# Patient Record
Sex: Male | Born: 1950 | Race: Asian | Hispanic: No | Smoking: Current every day smoker
Health system: Southern US, Community
[De-identification: ages and names within clinical notes are randomized; demographics above are authoritative.]

## PROBLEM LIST (undated history)

## (undated) DIAGNOSIS — Z8711 Personal history of peptic ulcer disease: Secondary | ICD-10-CM

## (undated) DIAGNOSIS — Z8719 Personal history of other diseases of the digestive system: Secondary | ICD-10-CM

## (undated) DIAGNOSIS — K219 Gastro-esophageal reflux disease without esophagitis: Secondary | ICD-10-CM

## (undated) DIAGNOSIS — K859 Acute pancreatitis without necrosis or infection, unspecified: Secondary | ICD-10-CM

## (undated) DIAGNOSIS — F1010 Alcohol abuse, uncomplicated: Secondary | ICD-10-CM

## (undated) DIAGNOSIS — Z86010 Personal history of colonic polyps: Principal | ICD-10-CM

## (undated) DIAGNOSIS — D649 Anemia, unspecified: Secondary | ICD-10-CM

## (undated) HISTORY — DX: Acute pancreatitis without necrosis or infection, unspecified: K85.90

## (undated) HISTORY — DX: Personal history of colonic polyps: Z86.010

## (undated) HISTORY — DX: Alcohol abuse, uncomplicated: F10.10

## (undated) HISTORY — DX: Anemia, unspecified: D64.9

## (undated) HISTORY — DX: Gastro-esophageal reflux disease without esophagitis: K21.9

## (undated) HISTORY — DX: Personal history of peptic ulcer disease: Z87.11

## (undated) HISTORY — DX: Personal history of other diseases of the digestive system: Z87.19

---

## 2009-04-10 ENCOUNTER — Emergency Department (HOSPITAL_COMMUNITY): Admission: EM | Admit: 2009-04-10 | Discharge: 2009-04-11 | Payer: Self-pay | Admitting: Emergency Medicine

## 2010-08-11 LAB — POCT I-STAT, CHEM 8
BUN: 15 mg/dL (ref 6–23)
Calcium, Ion: 1.11 mmol/L — ABNORMAL LOW (ref 1.12–1.32)
Chloride: 109 mEq/L (ref 96–112)
Creatinine, Ser: 0.9 mg/dL (ref 0.4–1.5)
Glucose, Bld: 92 mg/dL (ref 70–99)
Potassium: 3.2 mEq/L — ABNORMAL LOW (ref 3.5–5.1)

## 2015-06-23 ENCOUNTER — Ambulatory Visit (HOSPITAL_BASED_OUTPATIENT_CLINIC_OR_DEPARTMENT_OTHER)
Admission: RE | Admit: 2015-06-23 | Discharge: 2015-06-23 | Disposition: A | Discharge status: Home / Self Care | Payer: Medicare Other | Source: Ambulatory Visit | Attending: Medical | Admitting: Medical

## 2015-06-23 ENCOUNTER — Encounter: Payer: Self-pay | Admitting: Medical

## 2015-06-23 ENCOUNTER — Ambulatory Visit (INDEPENDENT_AMBULATORY_CARE_PROVIDER_SITE_OTHER): Payer: Medicare Other | Admitting: Medical

## 2015-06-23 VITALS — BP 118/76 | HR 91 | Temp 98.1°F | Ht 65.5 in | Wt 115.0 lb

## 2015-06-23 DIAGNOSIS — F1010 Alcohol abuse, uncomplicated: Secondary | ICD-10-CM | POA: Insufficient documentation

## 2015-06-23 DIAGNOSIS — F17200 Nicotine dependence, unspecified, uncomplicated: Secondary | ICD-10-CM | POA: Diagnosis not present

## 2015-06-23 DIAGNOSIS — Z87891 Personal history of nicotine dependence: Secondary | ICD-10-CM

## 2015-06-23 DIAGNOSIS — R059 Cough, unspecified: Secondary | ICD-10-CM

## 2015-06-23 DIAGNOSIS — R05 Cough: Secondary | ICD-10-CM

## 2015-06-23 DIAGNOSIS — Z8719 Personal history of other diseases of the digestive system: Secondary | ICD-10-CM

## 2015-06-23 DIAGNOSIS — Z720 Tobacco use: Secondary | ICD-10-CM

## 2015-06-23 DIAGNOSIS — D71 Functional disorders of polymorphonuclear neutrophils: Secondary | ICD-10-CM | POA: Diagnosis not present

## 2015-06-23 DIAGNOSIS — Z8711 Personal history of peptic ulcer disease: Secondary | ICD-10-CM | POA: Insufficient documentation

## 2015-06-23 DIAGNOSIS — R5383 Other fatigue: Secondary | ICD-10-CM

## 2015-06-23 NOTE — Progress Notes (Signed)
Subjective:    Patient ID: Chad Coffey 65, male    DOB: 10-17-1950, 65 y.o.   MRN: [id number]  HPI   I have reviewed pt PMH, PSH, FH, Social History and Surgical History  Pt is retired, no exercise, 1 cup coffee a day, widowed.   Pt has not been examined in some time for routine care.  Pt has some hx of heart burn and reflux in the past. Stomach Ulcer 20 yrs ago.  Pt has been drinking alcohol a lot last 2 years. About 12 pack a day.   Pt also smokes about a pack day. Pack a day past 2 years. Before over past 20 yrs only 2 cigarettes.   He drank and smoked heavy before wife passed but then got a little worse.  Both his daughters are not sure if he is depressed. Pt denies any.   He has been having some memory issues past year. Distant memory intact but recent memory not great per family.  Pt fell about one week ago in tub. Bruised stomach and his arm     Review of Systems  Constitutional: Positive for fatigue. Negative for fever and chills.       Family thinks he is fatigued pt denies. But they say at times he sleeps a lot.  HENT: Negative for congestion.   Respiratory: Positive for cough. Negative for choking, chest tightness, shortness of breath and wheezing.   Cardiovascular: Negative for chest pain and palpitations.  Gastrointestinal: Negative for nausea, abdominal pain, diarrhea, constipation, blood in stool and rectal pain.  Musculoskeletal: Negative for back pain.  Neurological: Negative for dizziness and light-headedness.  Hematological: Negative for adenopathy. Does not bruise/bleed easily.  Psychiatric/Behavioral: Negative for suicidal ideas, confusion, decreased concentration and agitation. The patient is not hyperactive.    Past Medical History  Diagnosis Date  . GERD (gastroesophageal reflux disease)     Social History   Social History  . Marital Status: Widowed    Spouse Name: N/A  . Number of Children: N/A  . Years of Education: N/A    Occupational History  . Not on file.   Social History Main Topics  . Smoking status: Current Every Day Smoker  . Smokeless tobacco: Never Used  . Alcohol Use: 0.0 oz/week    0 Standard drinks or equivalent per week     Comment: daily.   . Drug Use: Not on file  . Sexual Activity: Not on file   Other Topics Concern  . Not on file   Social History Narrative  . No narrative on file    No past surgical history on file.  No family history on file.  No Known Allergies  No current outpatient prescriptions on file prior to visit.   No current facility-administered medications on file prior to visit.    BP 118/76 mmHg  Pulse 91  Temp(Src) 98.1 F (36.7 C) (Oral)  Ht 5' 5.5" (1.664 m)  Wt 115 lb (52.164 kg)  BMI 18.84 kg/m2  SpO2 98%       Objective:   Physical Exam  General Mental Status- Alert. General Appearance- Not in acute distress.   Skin General: Color- Normal Color except faint burise rt upper abdomen  Neck Carotid Arteries- Normal color. Moisture- Normal Moisture. No carotid bruits. No JVD.  Chest and Lung Exam Auscultation: Breath Sounds:-Normal.  Cardiovascular Auscultation:Rythm- Regular. Murmurs & Other Heart Sounds:Auscultation of the heart reveals- No Murmurs.  Abdomen Inspection:-Inspeection Normal. Palpation/Percussion:Note:No mass. Palpation and Percussion of the  abdomen reveal- Non Tender, Non Distended + BS, no rebound or guarding.   Neurologic Cranial Nerve exam:- CN III-XII intact(No nystagmus), symmetric smile. Strength:- 5/5 equal and symmetric strength both upper and lower extremities.  Rt wrist- mild bruise but on palpation wrist and foreram no pain. Good rom.       Assessment & Plan:  With your history of smoking and alcohol abuse the best thing for your health long term is to stop all together or cut back significantly.  We will get labs to day to assess impact of drinking on your body and cxr to check you  lungs.  Follow up in 2 weeks early am and come in fasting. Will check your cholesterol and possible other labs as well.  Note this was pt first time here. Time was limited. We need to get in to lab before closed. I do want to discuss further family concerns about his memory. This may take some time since MMSE will need to be translated. Time did not allow this today.

## 2015-06-23 NOTE — Assessment & Plan Note (Signed)
Will get labs today to assess effects of heavy drinking. Encouraged/recommended to stop.

## 2015-06-23 NOTE — Patient Instructions (Signed)
With your history of smoking and alcohol abuse the best thing for your health long term is to stop all together or cut back significantly.  We will get labs to day to assess impact of drinking on your body and cxr to check you lungs.  Follow up in 2 weeks early am and come in fasting. Will check your cholesterol and possible other labs as well.

## 2015-06-23 NOTE — Progress Notes (Signed)
Pre visit review using our clinic review tool, if applicable. No additional management support is needed unless otherwise documented below in the visit note. 

## 2015-06-23 NOTE — Assessment & Plan Note (Signed)
Likely smoker cough and get xray today.

## 2015-06-24 ENCOUNTER — Telehealth: Payer: Self-pay | Admitting: Medical

## 2015-06-24 LAB — COMPREHENSIVE METABOLIC PANEL
ALBUMIN: 4.4 g/dL (ref 3.5–5.2)
ALK PHOS: 85 U/L (ref 39–117)
ALT: 66 U/L — ABNORMAL HIGH (ref 0–53)
AST: 92 U/L — ABNORMAL HIGH (ref 0–37)
BUN: 9 mg/dL (ref 6–23)
CALCIUM: 9.5 mg/dL (ref 8.4–10.5)
CO2: 24 mEq/L (ref 19–32)
Chloride: 87 mEq/L — ABNORMAL LOW (ref 96–112)
Creatinine, Ser: 1.04 mg/dL (ref 0.40–1.50)
GFR: 76.14 mL/min (ref >60.00)
Glucose, Bld: 87 mg/dL (ref 70–99)
POTASSIUM: 2.9 meq/L — AB (ref 3.5–5.1)
SODIUM: 127 meq/L — AB (ref 135–145)
TOTAL PROTEIN: 7.8 g/dL (ref 6.0–8.3)
Total Bilirubin: 0.8 mg/dL (ref 0.2–1.2)

## 2015-06-24 LAB — CBC WITH DIFFERENTIAL/PLATELET
Basophils Absolute: 0 10*3/uL (ref 0.0–0.1)
Basophils Relative: 0.3 % (ref 0.0–3.0)
EOS PCT: 2.1 % (ref 0.0–5.0)
Eosinophils Absolute: 0.2 10*3/uL (ref 0.0–0.7)
HEMATOCRIT: 37.5 % — AB (ref 39.0–52.0)
HEMOGLOBIN: 12.8 g/dL — AB (ref 13.0–17.0)
LYMPHS PCT: 20.1 % (ref 12.0–46.0)
Lymphs Abs: 1.8 10*3/uL (ref 0.7–4.0)
MCHC: 34 g/dL (ref 30.0–36.0)
MCV: 82.1 fl (ref 78.0–100.0)
MONO ABS: 1 10*3/uL (ref 0.1–1.0)
Monocytes Relative: 11.8 % (ref 3.0–12.0)
Neutro Abs: 5.8 10*3/uL (ref 1.4–7.7)
Neutrophils Relative %: 65.7 % (ref 43.0–77.0)
Platelets: 306 10*3/uL (ref 150.0–400.0)
RBC: 4.57 Mil/uL (ref 4.22–5.81)
RDW: 13.7 % (ref 11.5–15.5)
WBC: 8.9 10*3/uL (ref 4.0–10.5)

## 2015-06-24 LAB — AMYLASE: AMYLASE: 172 U/L — AB (ref 27–131)

## 2015-06-24 LAB — GAMMA GT: GGT: 165 U/L — AB (ref 7–51)

## 2015-06-24 LAB — LIPASE: Lipase: 84 U/L — ABNORMAL HIGH (ref 11.0–59.0)

## 2015-06-24 MED ORDER — POTASSIUM CHLORIDE CRYS ER 20 MEQ PO TBCR: 20.0000 meq | EXTENDED_RELEASE_TABLET | Freq: Every day | ORAL | Status: DC

## 2015-06-24 NOTE — Telephone Encounter (Signed)
Sent k tabs to his pharmacy

## 2015-06-25 NOTE — Telephone Encounter (Signed)
Pt daughter was made aware on 06/24/15.

## 2015-06-27 ENCOUNTER — Ambulatory Visit (INDEPENDENT_AMBULATORY_CARE_PROVIDER_SITE_OTHER): Payer: Medicare Other | Admitting: Medical

## 2015-06-27 ENCOUNTER — Encounter: Payer: Self-pay | Admitting: Internal Medicine

## 2015-06-27 ENCOUNTER — Encounter: Payer: Self-pay | Admitting: Medical

## 2015-06-27 ENCOUNTER — Ambulatory Visit (HOSPITAL_BASED_OUTPATIENT_CLINIC_OR_DEPARTMENT_OTHER)
Admission: RE | Admit: 2015-06-27 | Discharge: 2015-06-27 | Disposition: A | Discharge status: Home / Self Care | Payer: Medicare Other | Source: Ambulatory Visit | Attending: Medical | Admitting: Medical

## 2015-06-27 VITALS — BP 138/99 | HR 98 | Temp 97.7°F | Ht 65.5 in | Wt 114.4 lb

## 2015-06-27 DIAGNOSIS — F1010 Alcohol abuse, uncomplicated: Secondary | ICD-10-CM | POA: Insufficient documentation

## 2015-06-27 DIAGNOSIS — K5730 Diverticulosis of large intestine without perforation or abscess without bleeding: Secondary | ICD-10-CM | POA: Insufficient documentation

## 2015-06-27 DIAGNOSIS — K858 Other acute pancreatitis without necrosis or infection: Secondary | ICD-10-CM

## 2015-06-27 DIAGNOSIS — K760 Fatty (change of) liver, not elsewhere classified: Secondary | ICD-10-CM | POA: Insufficient documentation

## 2015-06-27 DIAGNOSIS — K859 Acute pancreatitis without necrosis or infection, unspecified: Secondary | ICD-10-CM | POA: Diagnosis present

## 2015-06-27 DIAGNOSIS — Z113 Encounter for screening for infections with a predominantly sexual mode of transmission: Secondary | ICD-10-CM | POA: Diagnosis not present

## 2015-06-27 DIAGNOSIS — Z1211 Encounter for screening for malignant neoplasm of colon: Secondary | ICD-10-CM

## 2015-06-27 DIAGNOSIS — D649 Anemia, unspecified: Secondary | ICD-10-CM

## 2015-06-27 LAB — COMPREHENSIVE METABOLIC PANEL
ALBUMIN: 4.5 g/dL (ref 3.5–5.2)
ALT: 57 U/L — ABNORMAL HIGH (ref 0–53)
AST: 63 U/L — ABNORMAL HIGH (ref 0–37)
Alkaline Phosphatase: 81 U/L (ref 39–117)
BUN: 6 mg/dL (ref 6–23)
CHLORIDE: 92 meq/L — AB (ref 96–112)
CO2: 26 mEq/L (ref 19–32)
Calcium: 9.8 mg/dL (ref 8.4–10.5)
Creatinine, Ser: 1.04 mg/dL (ref 0.40–1.50)
GFR: 76.14 mL/min (ref >60.00)
Glucose, Bld: 106 mg/dL — ABNORMAL HIGH (ref 70–99)
POTASSIUM: 3 meq/L — AB (ref 3.5–5.1)
SODIUM: 135 meq/L (ref 135–145)
Total Bilirubin: 0.6 mg/dL (ref 0.2–1.2)
Total Protein: 7.9 g/dL (ref 6.0–8.3)

## 2015-06-27 LAB — IRON AND TIBC
%SAT: 31 % (ref 15–60)
IRON: 97 ug/dL (ref 50–180)
TIBC: 314 ug/dL (ref 250–425)
UIBC: 217 ug/dL (ref 125–400)

## 2015-06-27 LAB — LIPASE: Lipase: 59 U/L (ref 11.0–59.0)

## 2015-06-27 LAB — FERRITIN: FERRITIN: 246.9 ng/mL (ref 22.0–322.0)

## 2015-06-27 LAB — AMYLASE: Amylase: 97 U/L (ref 27–131)

## 2015-06-27 MED ORDER — IOHEXOL 300 MG/ML  SOLN
100.0000 mL | Freq: Once | INTRAMUSCULAR | Status: AC | PRN
  Administered 2015-06-27: 100 mL via INTRAVENOUS

## 2015-06-27 NOTE — Patient Instructions (Addendum)
Extremely important to stop drinking alcohol. You have decreased but now try to stop completley.  Avoid fatty and greasy foods with pancrease inflamed. Hydrate well. Recommend propel fitness water and eat bland foods.   If abdomen pain increases or other symptoms as discussed then ED evaluation.  Will give tdap, psv 23, today.  GI referral.   Repeat labs today. Ct abd/pelvis today stat.  Get ct of abdomen today. Follow up in 7 days or as needed

## 2015-06-27 NOTE — Progress Notes (Signed)
Pre visit review using our clinic review tool, if applicable. No additional management support is needed unless otherwise documented below in the visit note. 

## 2015-06-27 NOTE — Progress Notes (Signed)
Subjective:    Patient ID: Javoni 74, male    DOB: 1951/04/22, 65 y.o.   MRN: [id number]  HPI  Pt in for follow up. I reviewed labs  low na, low k, increased lft, increased amylase, increased lipase, mild anemia, and increased ggt.   No abdomen pain(but stoic and he never complains per daughters), no nausea, no vomiting, and no back pain.  Pt was advised to stop drinking etoh  completely. He was drinking 12 pack a day. No only 4 beers a day.      Review of Systems  Constitutional: Negative for fever, chills and fatigue.  Respiratory: Negative for cough, chest tightness, shortness of breath and wheezing.   Cardiovascular: Negative for chest pain and palpitations.  Gastrointestinal: Negative for nausea, vomiting, abdominal pain, diarrhea, constipation, blood in stool, abdominal distention and rectal pain.  Musculoskeletal: Negative for back pain.  Skin: Negative for rash.  Hematological: Negative for adenopathy. Does not bruise/bleed easily.   Past Medical History  Diagnosis Date  . GERD (gastroesophageal reflux disease)     Social History   Social History  . Marital Status: Widowed    Spouse Name: N/A  . Number of Children: N/A  . Years of Education: N/A   Occupational History  . Not on file.   Social History Main Topics  . Smoking status: Current Every Day Smoker  . Smokeless tobacco: Never Used  . Alcohol Use: 0.0 oz/week    0 Standard drinks or equivalent per week     Comment: daily.   . Drug Use: Not on file  . Sexual Activity: Not on file   Other Topics Concern  . Not on file   Social History Narrative    No past surgical history on file.  No family history on file.  No Known Allergies  Current Outpatient Prescriptions on File Prior to Visit  Medication Sig Dispense Refill  . potassium chloride SA (K-DUR,KLOR-CON) 20 MEQ tablet Take 1 tablet (20 mEq total) by mouth daily. 14 tablet 0   No current facility-administered medications on file  prior to visit.    BP 138/99 mmHg  Pulse 98  Temp(Src) 97.7 F (36.5 C) (Oral)  Ht 5' 5.5" (1.664 m)  Wt 114 lb 6.4 oz (51.891 kg)  BMI 18.74 kg/m2       Objective:   Physical Exam  Physical Exam  General Mental Status- Alert. General Appearance- Not in acute distress.   Skin General: Color- Normal Color except faint burise rt upper abdomen  Neck Carotid Arteries- Normal color. Moisture- Normal Moisture. No carotid bruits. No JVD.  Chest and Lung Exam Auscultation: Breath Sounds:-Normal.  Cardiovascular Auscultation:Rythm- Regular. Murmurs & Other Heart Sounds:Auscultation of the heart reveals- No Murmurs.  Abdomen Inspection:-Inspeection Normal. Palpation/Percussion:Note:No mass. Palpation and Percussion of the abdomen reveal- Non Tender, Non Distended + BS, no rebound or guarding.   Neurologic Cranial Nerve exam:- CN III-XII intact(No nystagmus), symmetric smile. Strength:- 5/5 equal and symmetric strength both upper and lower extremities.  Rt wrist- mild bruise but on palpation wrist and foreram no pain. Good rom.       Assessment & Plan:  Extremely important to stop drinking alcohol. You have decreased but now try to stop completley.  Avoid fatty and greasy foods with pancrease inflamed. Hydrate well. Recommend propel fitness water and eat bland foods.   If abdomen pain increases or other symptoms as discussed then ED evaluation.  Will give tdap, psv 23, today.  GI referral.  Follow  up in 7 days or as needed

## 2015-07-02 ENCOUNTER — Telehealth: Payer: Self-pay | Admitting: Medical

## 2015-07-02 LAB — VITAMIN B1: Vitamin B1 (Thiamine): <7 nmol/L — ABNORMAL LOW (ref 8–30)

## 2015-07-02 MED ORDER — VITAMIN B-1 100 MG PO TABS: 100.0000 mg | ORAL_TABLET | Freq: Every day | ORAL | Status: AC

## 2015-07-02 NOTE — Progress Notes (Signed)
Quick Note:  Pt has seen results on MyChart and message also sent for patient to call back if any questions. ______ 

## 2015-07-02 NOTE — Telephone Encounter (Signed)
rx thiamine sent to his pharmacy. Repeat b1 level in one month after he starts.

## 2015-07-02 NOTE — Telephone Encounter (Signed)
Left message for daughter to call back with any questions. They have seen the results on MyChart.

## 2015-07-07 ENCOUNTER — Ambulatory Visit: Payer: Medicare Other | Admitting: Medical

## 2015-07-08 ENCOUNTER — Encounter: Payer: Self-pay | Admitting: Medical

## 2015-07-08 ENCOUNTER — Telehealth: Payer: Self-pay | Admitting: Medical

## 2015-07-08 ENCOUNTER — Ambulatory Visit (INDEPENDENT_AMBULATORY_CARE_PROVIDER_SITE_OTHER): Payer: Medicare Other | Admitting: Medical

## 2015-07-08 VITALS — BP 120/80 | HR 68 | Temp 98.0°F | Ht 66.0 in | Wt 117.6 lb

## 2015-07-08 DIAGNOSIS — Z113 Encounter for screening for infections with a predominantly sexual mode of transmission: Secondary | ICD-10-CM | POA: Diagnosis not present

## 2015-07-08 DIAGNOSIS — E876 Hypokalemia: Secondary | ICD-10-CM

## 2015-07-08 DIAGNOSIS — Z23 Encounter for immunization: Secondary | ICD-10-CM | POA: Diagnosis not present

## 2015-07-08 DIAGNOSIS — F1010 Alcohol abuse, uncomplicated: Secondary | ICD-10-CM | POA: Diagnosis not present

## 2015-07-08 LAB — COMPREHENSIVE METABOLIC PANEL
ALK PHOS: 54 U/L (ref 39–117)
ALT: 24 U/L (ref 0–53)
AST: 28 U/L (ref 0–37)
Albumin: 3.9 g/dL (ref 3.5–5.2)
BILIRUBIN TOTAL: 0.6 mg/dL (ref 0.2–1.2)
BUN: 7 mg/dL (ref 6–23)
CALCIUM: 8.9 mg/dL (ref 8.4–10.5)
CO2: 30 mEq/L (ref 19–32)
Chloride: 102 mEq/L (ref 96–112)
Creatinine, Ser: 1.06 mg/dL (ref 0.40–1.50)
GFR: 74.48 mL/min (ref >60.00)
Glucose, Bld: 93 mg/dL (ref 70–99)
POTASSIUM: 3.2 meq/L — AB (ref 3.5–5.1)
Sodium: 137 mEq/L (ref 135–145)
TOTAL PROTEIN: 7 g/dL (ref 6.0–8.3)

## 2015-07-08 MED ORDER — POTASSIUM CHLORIDE CRYS ER 20 MEQ PO TBCR: 20.0000 meq | EXTENDED_RELEASE_TABLET | Freq: Every day | ORAL | Status: AC

## 2015-07-08 NOTE — Patient Instructions (Signed)
Please continue to reduce alcohol intake. Continue with thiamine otc.  Will get your k level today to determine if still low. With increased appetite and better diet this may come up without supplementation.  Will get hiv, screening, tdap vacccine, psv 23 vaccine and influenza vaccine.  Follow up date to be determined after lab review.

## 2015-07-08 NOTE — Progress Notes (Signed)
Pre visit review using our clinic review tool, if applicable. No additional management support is needed unless otherwise documented below in the visit note. 

## 2015-07-08 NOTE — Telephone Encounter (Signed)
rx refill of k-dur. His k is improved but still low. In addition to refill of k tab. Eat a banana every other day.

## 2015-07-08 NOTE — Progress Notes (Signed)
Subjective:    Patient ID: Chad Coffey 28, male    DOB: Jul 20, 1950, 65 y.o.   MRN: [id number]  HPI   Pt in for follow up.   Pt is still cutting back on alcohol. He is eating more now that cutting back.(Pt is drinking about 4 beers a day) Pt has been taking vitamin b1.   Pt is just now finishing up on his k tablets.  Pt only had coffee today.  Pt has GI appointment in April or march for colnosocpy.     Review of Systems  Constitutional: Negative for fever, chills and fatigue.  Respiratory: Negative for cough, chest tightness, shortness of breath and wheezing.   Cardiovascular: Negative for chest pain and palpitations.  Gastrointestinal: Negative for abdominal pain.  Musculoskeletal: Negative for myalgias, back pain and joint swelling.  Skin: Negative for rash.  Neurological: Negative for dizziness and headaches.  Hematological: Negative for adenopathy. Does not bruise/bleed easily.  Psychiatric/Behavioral: Negative for behavioral problems and confusion.    Past Medical History  Diagnosis Date  . GERD (gastroesophageal reflux disease)     Social History   Social History  . Marital Status: Widowed    Spouse Name: N/A  . Number of Children: N/A  . Years of Education: N/A   Occupational History  . Not on file.   Social History Main Topics  . Smoking status: Current Every Day Smoker  . Smokeless tobacco: Never Used  . Alcohol Use: 0.0 oz/week    0 Standard drinks or equivalent per week     Comment: daily.   . Drug Use: Not on file  . Sexual Activity: Not on file   Other Topics Concern  . Not on file   Social History Narrative    No past surgical history on file.  No family history on file.  No Known Allergies  Current Outpatient Prescriptions on File Prior to Visit  Medication Sig Dispense Refill  . potassium chloride SA (K-DUR,KLOR-CON) 20 MEQ tablet Take 1 tablet (20 mEq total) by mouth daily. 14 tablet 0  . thiamine (VITAMIN B-1) 100 MG tablet  Take 1 tablet (100 mg total) by mouth daily. 30 tablet 0   No current facility-administered medications on file prior to visit.    BP 120/80 mmHg  Pulse 68  Temp(Src) 98 F (36.7 C) (Oral)  Ht 5\' 6"  (1.676 m)  Wt 117 lb 9.6 oz (53.343 kg)  BMI 18.99 kg/m2  SpO2 98%       Objective:   Physical Exam  General Mental Status- Alert. General Appearance- Not in acute distress.   Skin General: Color- Normal Color. Moisture- Normal Moisture.  Neck Carotid Arteries- Normal color. Moisture- Normal Moisture. No carotid bruits. No JVD.  Chest and Lung Exam Auscultation: Breath Sounds:-Normal.  Cardiovascular Auscultation:Rythm- Regular. Murmurs & Other Heart Sounds:Auscultation of the heart reveals- No Murmurs.  Abdomen Inspection:-Inspeection Normal. Palpation/Percussion:Note:No mass. Palpation and Percussion of the abdomen reveal- Non Tender, Non Distended + BS, no rebound or guarding.    Neurologic Cranial Nerve exam:- CN III-XII intact(No nystagmus), symmetric smile. Strength:- 5/5 equal and symmetric strength both upper and lower extremities.      Assessment & Plan:  Please continue to reduce alcohol intake. Continue with thiamine otc.  Will get your k level today to determine if still low. With increased appetite and better diet this may come up without supplementation.  Will get hiv, screening, tdap vacccine, psv 23 vaccine and influenza vaccine.  Follow up date to be  determined after lab review.

## 2015-07-09 LAB — HIV ANTIBODY (ROUTINE TESTING W REFLEX): HIV 1&2 Ab, 4th Generation: NONREACTIVE

## 2015-07-10 ENCOUNTER — Other Ambulatory Visit (INDEPENDENT_AMBULATORY_CARE_PROVIDER_SITE_OTHER): Payer: Medicare Other

## 2015-07-10 DIAGNOSIS — Z8719 Personal history of other diseases of the digestive system: Secondary | ICD-10-CM

## 2015-07-10 DIAGNOSIS — Z8711 Personal history of peptic ulcer disease: Secondary | ICD-10-CM

## 2015-07-10 DIAGNOSIS — R5383 Other fatigue: Secondary | ICD-10-CM | POA: Diagnosis not present

## 2015-07-10 LAB — FECAL OCCULT BLOOD, IMMUNOCHEMICAL: Fecal Occult Bld: NEGATIVE

## 2015-07-10 NOTE — Progress Notes (Signed)
Quick Note:  Pt has seen results on MyChart and message also sent for patient to call back if any questions. ______ 

## 2015-07-14 NOTE — Progress Notes (Signed)
Quick Note:  Pt has seen results on MyChart and message also sent for patient to call back if any questions. ______ 

## 2015-08-15 ENCOUNTER — Ambulatory Visit (INDEPENDENT_AMBULATORY_CARE_PROVIDER_SITE_OTHER): Payer: Medicare Other | Admitting: Internal Medicine

## 2015-08-15 ENCOUNTER — Encounter: Payer: Self-pay | Admitting: Internal Medicine

## 2015-08-15 VITALS — BP 186/100 | HR 88 | Ht 65.0 in | Wt 126.2 lb

## 2015-08-15 DIAGNOSIS — F1020 Alcohol dependence, uncomplicated: Secondary | ICD-10-CM

## 2015-08-15 DIAGNOSIS — Z1211 Encounter for screening for malignant neoplasm of colon: Secondary | ICD-10-CM | POA: Diagnosis not present

## 2015-08-15 DIAGNOSIS — K700 Alcoholic fatty liver: Secondary | ICD-10-CM

## 2015-08-15 DIAGNOSIS — K852 Alcohol induced acute pancreatitis without necrosis or infection: Secondary | ICD-10-CM | POA: Diagnosis not present

## 2015-08-15 NOTE — Patient Instructions (Signed)
   You have been scheduled for a colonoscopy. Please follow written instructions given to you at your visit today.  Please pick up your prep supplies at the pharmacy. If you use inhalers (even only as needed), please bring them with you on the day of your procedure.   Please reduce your alcohol intake to 2 drinks or less daily.  Information given on alcohol use.    I appreciate the opportunity to care for you.

## 2015-08-15 NOTE — Progress Notes (Signed)
  Referred by: Mackie Pai, PA-C  Subjective:    Patient ID: Amedeo 5, male    DOB: 12/16/1950, 65 y.o.   MRN: [id number] Chief complaint: Recent pancreatitis, alcohol abuse HPI Patient is a pleasant [place] man here with 2 of his daughters, at the request of the physician assistant listed above because of recent alcoholic pancreatitis. The patient was drinking 12 beers a day or more, and was not feeling well had elevated amylase and lipase, also elevated transaminases. Further workup with hepatitis C antibody and other labs did not reveal any particular cause other than alcohol. An abdominal and pelvic CT scan showed fatty liver and normal pancreas. With great reduction in alcohol down to about 4 beers a day his LFTs and pancreas enzymes normalized. Denies any pain or problems now. His daughters are interested in pursuing a screening colonoscopy and he is willing to do that. Medications, allergies, past medical history, past surgical history, family history and social history are reviewed and updated in the EMR.   Review of Systems As above. All other review of systems are negative    Objective:   Physical Exam @BP  186/100 mmHg  Pulse 88  Ht 5\' 5"  (1.651 m)  Wt 126 lb 4 oz (57.267 kg)  BMI 21.01 kg/m2@  General:  Well-developed, well-nourished and in no acute distress he is a thin small but normal-appearing Asian man Eyes:  anicteric. ENT:   Mouth and posterior pharynx free of lesions.  Neck:   supple w/o thyromegaly or mass.  Lungs: Clear to auscultation bilaterally. Heart:  S1S2, no rubs, murmurs, gallops. Abdomen:  soft, non-tender, no hepatosplenomegaly, hernia, or mass and BS+.  Rectal: Deferred Lymph:  no cervical or supraclavicular adenopathy. Extremities:   no edema, cyanosis or clubbing Skin   no rash. Neuro:  A&O x 3.  Psych:  appropriate mood and  Affect.   Data Reviewed: As per history of present illness have reviewed primary care notes, labs in the  computer. These are from 2017.       Assessment & Plan:   Encounter Diagnoses  Name Primary?  Marland Kitchen Uncomplicated alcohol dependence ([place]) Yes  . Alcoholic fatty liver   . Acute alcoholic pancreatitis   . Colon cancer screening    He is improved with reduced alcohol. I've advised not to drink more than 2 beers a day. Budesonide quit. Educated about reducing and eliminating alcohol and gave handouts Screening colonoscopy The risks and benefits as well as alternatives of endoscopic procedure(s) have been discussed and reviewed. All questions answered. The patient agrees to proceed.

## 2015-08-18 ENCOUNTER — Ambulatory Visit (AMBULATORY_SURGERY_CENTER): Payer: Medicare Other | Admitting: Internal Medicine

## 2015-08-18 ENCOUNTER — Encounter: Payer: Self-pay | Admitting: Internal Medicine

## 2015-08-18 VITALS — BP 184/116 | HR 85 | Temp 98.0°F | Resp 24 | Ht 65.0 in | Wt 126.0 lb

## 2015-08-18 DIAGNOSIS — D121 Benign neoplasm of appendix: Secondary | ICD-10-CM

## 2015-08-18 DIAGNOSIS — D129 Benign neoplasm of anus and anal canal: Secondary | ICD-10-CM

## 2015-08-18 DIAGNOSIS — Z1211 Encounter for screening for malignant neoplasm of colon: Secondary | ICD-10-CM

## 2015-08-18 DIAGNOSIS — D128 Benign neoplasm of rectum: Secondary | ICD-10-CM

## 2015-08-18 MED ORDER — SODIUM CHLORIDE 0.9 % IV SOLN: 500.0000 mL | INTRAVENOUS | Status: DC

## 2015-08-18 NOTE — Progress Notes (Signed)
Called to room to assist during endoscopic procedure.  Patient ID and intended procedure confirmed with present staff. Received instructions for my participation in the procedure from the performing physician.  

## 2015-08-18 NOTE — Op Note (Signed)
[facility] Patient Name: Chad Coffey Procedure Date: 08/18/2015 2:39 PM MRN: [id number] Endoscopist: Gatha Mayer , MD Age: 65 Date of Birth: 30-Jun-1950 Gender: Male Procedure:                Colonoscopy Indications:              Screening for colorectal malignant neoplasm Medicines:                Propofol per Anesthesia, Monitored Anesthesia Care Procedure:                Pre-Anesthesia Assessment:                           - Prior to the procedure, a History and Physical                            was performed, and patient medications and                            allergies were reviewed. The patient's tolerance of                            previous anesthesia was also reviewed. The risks                            and benefits of the procedure and the sedation                            options and risks were discussed with the patient.                            All questions were answered, and informed consent                            was obtained. Prior Anticoagulants: The patient has                            taken no previous anticoagulant or antiplatelet                            agents. ASA Grade Assessment: II - A patient with                            mild systemic disease. After reviewing the risks                            and benefits, the patient was deemed in                            satisfactory condition to undergo the procedure.                           After obtaining informed consent, the colonoscope  was passed under direct vision. Throughout the                            procedure, the patient's blood pressure, pulse, and                            oxygen saturations were monitored continuously. The                            Model CF-[id number]) scope was introduced                            through the anus and advanced to the the cecum,                            identified by appendiceal  orifice and ileocecal                            valve. The colonoscopy was performed without                            difficulty. The patient tolerated the procedure                            well. The quality of the bowel preparation was                            excellent. The bowel preparation used was Miralax.                            The ileocecal valve, appendiceal orifice, and                            rectum were photographed. Scope In: 2:47:04 PM Scope Out: 2:59:10 PM Scope Withdrawal Time: 0 hours 9 minutes 44 seconds  Total Procedure Duration: 0 hours 12 minutes 6 seconds  Findings:                 The perianal and digital rectal examinations were                            normal. Pertinent negatives include normal prostate                            (size, shape, and consistency).                           Two sessile polyps were found in the rectum and                            appendiceal orifice. The polyps were 2 to 3 mm in                            size. These polyps were removed with a cold biopsy  forceps. Resection and retrieval were complete.                            Verification of patient identification for the                            specimen was done. Estimated blood loss was minimal.                           A 5 mm polyp was found in the rectum. The polyp was                            sessile. The polyp was removed with a cold snare.                            Resection and retrieval were complete. Verification                            of patient identification for the specimen was                            done. Estimated blood loss was minimal.                           Scattered diverticula were found in the entire                            colon. There was no evidence of diverticular                            bleeding.                           The exam was otherwise without abnormality on                             direct and retroflexion views. Complications:            No immediate complications. Estimated Blood Loss:     Estimated blood loss was minimal. Impression:               - Two 2 to 3 mm polyps in the rectum and at the                            appendiceal orifice, removed with a cold biopsy                            forceps. Resected and retrieved.                           - One 5 mm polyp in the rectum, removed with a cold                            snare. Resected and retrieved.                           -  Mild diverticulosis in the entire examined colon.                            There was no evidence of diverticular bleeding.                           - The examination was otherwise normal on direct                            and retroflexion views. Recommendation:           - Patient has a contact number available for                            emergencies. The signs and symptoms of potential                            delayed complications were discussed with the                            patient. Return to normal activities tomorrow.                            Written discharge instructions were provided to the                            patient.                           - Resume previous diet.                           - Continue present medications.                           - Repeat colonoscopy is recommended. The                            colonoscopy date will be determined after pathology                            results from today's exam become available for                            review. Gatha Mayer, MD 08/18/2015 3:04:55 PM This report has been signed electronically. CC Letter to:             [employer]

## 2015-08-18 NOTE — Patient Instructions (Addendum)
I found and removed three small polyps.  I will let you know pathology results and when to have another routine colonoscopy by mail.  You also have a condition called diverticulosis - common and not usually a problem. Please read the handout provided.  Please continue to reduce if not eliminate alcohol.  I appreciate the opportunity to care for you. Gatha Mayer, MD, FACG   YOU HAD AN ENDOSCOPIC PROCEDURE TODAY AT [facility]:   Refer to the procedure report that was given to you for any specific questions about what was found during the examination.  If the procedure report does not answer your questions, please call your gastroenterologist to clarify.  If you requested that your care partner not be given the details of your procedure findings, then the procedure report has been included in a sealed envelope for you to review at your convenience later.  YOU SHOULD EXPECT: Some feelings of bloating in the abdomen. Passage of more gas than usual.  Walking can help get rid of the air that was put into your GI tract during the procedure and reduce the bloating. If you had a lower endoscopy (such as a colonoscopy or flexible sigmoidoscopy) you may notice spotting of blood in your stool or on the toilet paper. If you underwent a bowel prep for your procedure, you may not have a normal bowel movement for a few days.  Please Note:  You might notice some irritation and congestion in your nose or some drainage.  This is from the oxygen used during your procedure.  There is no need for concern and it should clear up in a day or so.  SYMPTOMS TO REPORT IMMEDIATELY:   Following lower endoscopy (colonoscopy or flexible sigmoidoscopy):  Excessive amounts of blood in the stool  Significant tenderness or worsening of abdominal pains  Swelling of the abdomen that is new, acute  Fever of 100F or higher   For urgent or emergent issues, a gastroenterologist can be reached at any  hour by calling [phone].   DIET: Your first meal following the procedure should be a small meal and then it is ok to progress to your normal diet. Heavy or fried foods are harder to digest and may make you feel nauseous or bloated.  Likewise, meals heavy in dairy and vegetables can increase bloating.  Drink plenty of fluids but you should avoid alcoholic beverages for 24 hours.  ACTIVITY:  You should plan to take it easy for the rest of today and you should NOT DRIVE or use heavy machinery until tomorrow (because of the sedation medicines used during the test).    FOLLOW UP: Our staff will call the number listed on your records the next business day following your procedure to check on you and address any questions or concerns that you may have regarding the information given to you following your procedure. If we do not reach you, we will leave a message.  However, if you are feeling well and you are not experiencing any problems, there is no need to return our call.  We will assume that you have returned to your regular daily activities without incident.  Read all of the handouts given to you by your recovery room nurse.  If any biopsies were taken you will be contacted by [facility] or by letter within the next 1-3 weeks.  Please call us at [phone] if you have not heard about the biopsies in 3 weeks.  SIGNATURES/CONFIDENTIALITY: You and/or your care partner have signed paperwork which will be entered into your electronic medical record.  These signatures attest to the fact that that the information above on your After Visit Summary has been reviewed and is understood.  Full responsibility of the confidentiality of this discharge information lies with you and/or your care-partner.

## 2015-08-18 NOTE — Progress Notes (Signed)
Report to PACU, RN, vss, BBS= Clear.  

## 2015-08-19 ENCOUNTER — Telehealth: Payer: Self-pay

## 2015-08-19 NOTE — Telephone Encounter (Signed)
Left a message for the pt at  [phone] for the pt to call us back if any questions or concerns. maw

## 2015-08-25 ENCOUNTER — Encounter: Payer: Self-pay | Admitting: Internal Medicine

## 2015-08-25 DIAGNOSIS — Z860101 Personal history of adenomatous and serrated colon polyps: Secondary | ICD-10-CM | POA: Insufficient documentation

## 2015-08-25 DIAGNOSIS — Z86010 Personal history of colonic polyps: Secondary | ICD-10-CM

## 2015-08-25 HISTORY — DX: Personal history of colonic polyps: Z86.010

## 2015-08-25 HISTORY — DX: Personal history of adenomatous and serrated colon polyps: Z86.0101

## 2015-08-25 NOTE — Progress Notes (Signed)
Quick Note:  2 adenomas both diminutive Recall 2022 ______

## 2017-04-07 IMAGING — DX DG CHEST 2V
2 series · 2 of 2 positions shown · non-contrast
Comparison: 04/10/2009

CLINICAL DATA: Cough and history of tobacco use

EXAM:
CHEST  2 VIEW

[chest pa]
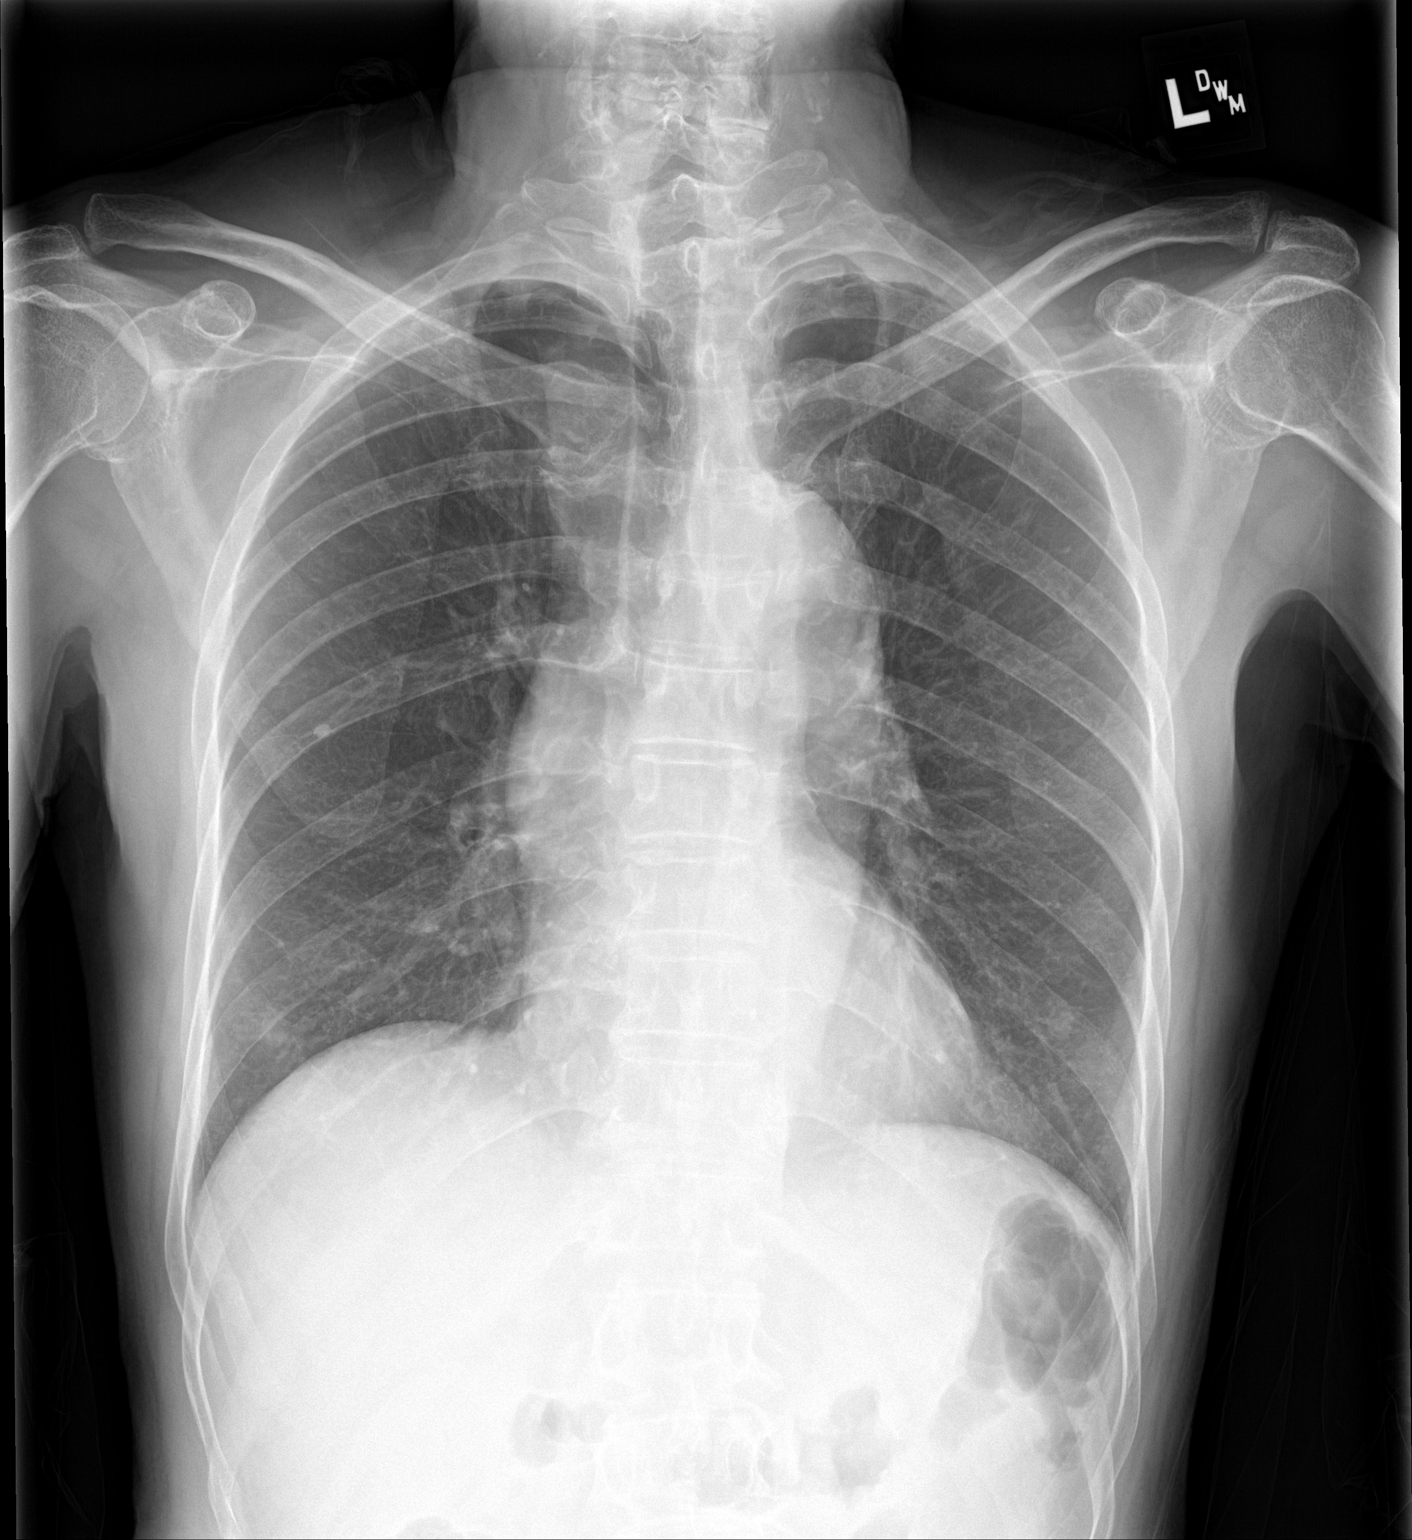

[chest lat]
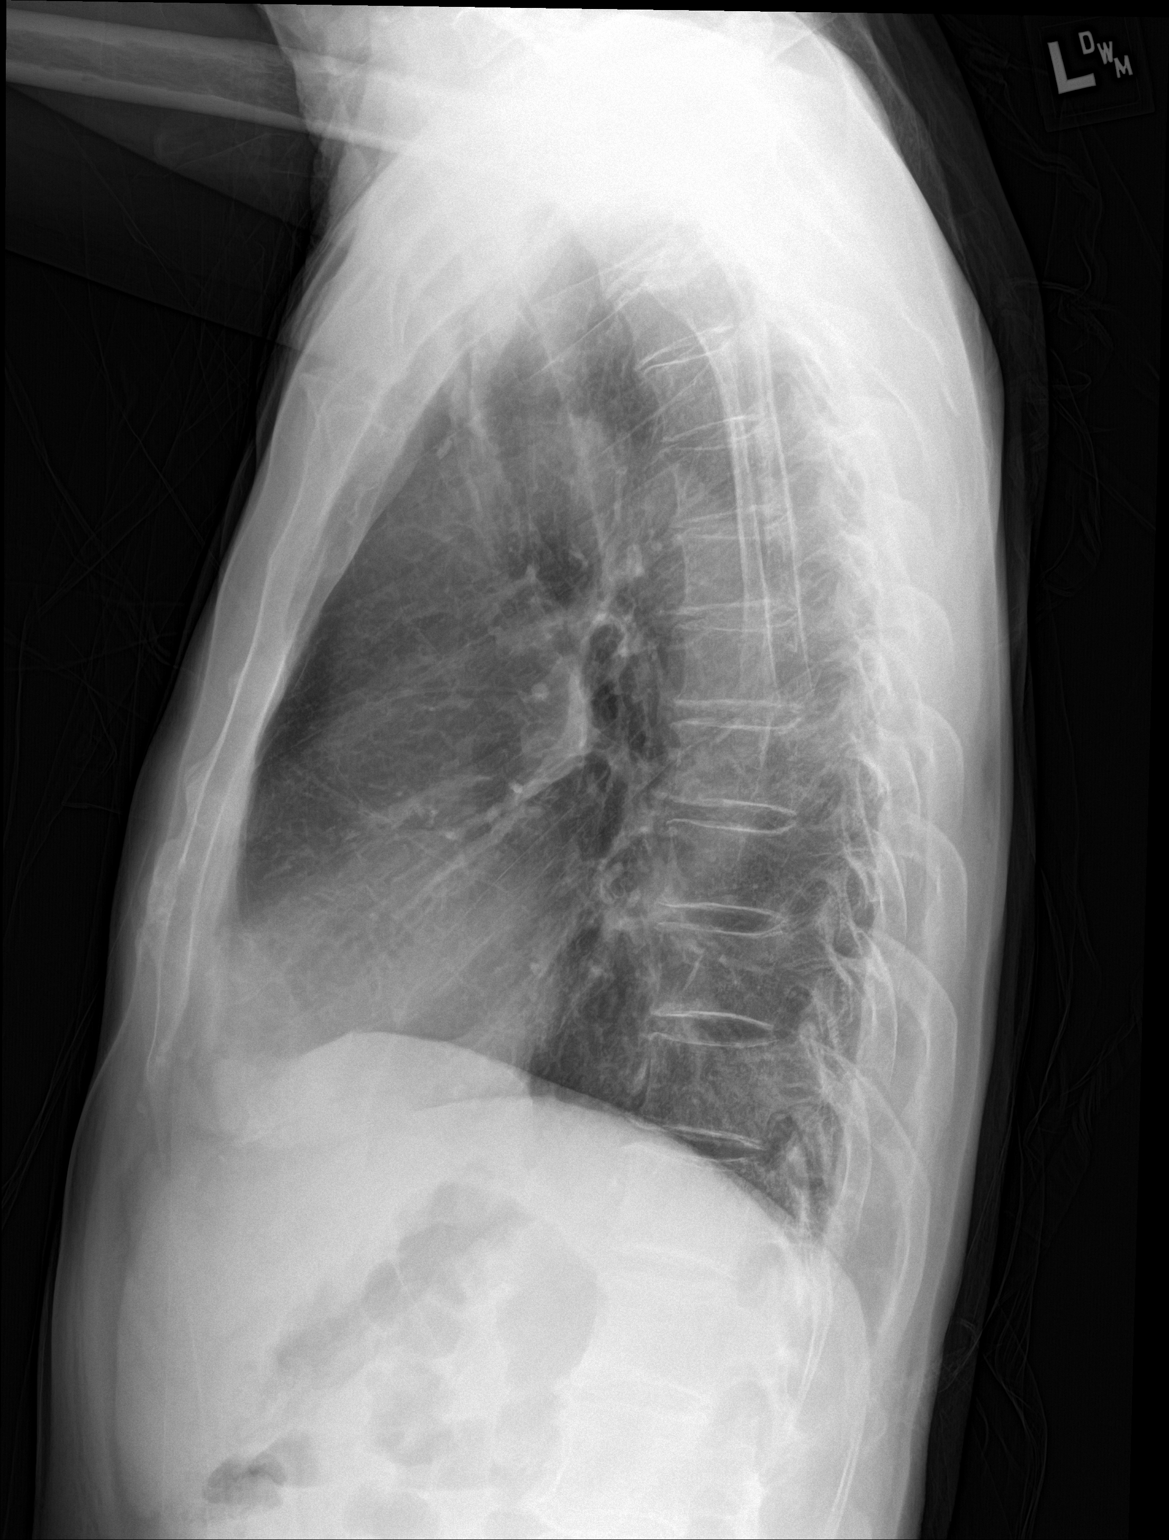

[2 of 2 positions shown; findings below may reference images not displayed]

FINDINGS: Cardiac shadow is stable. The thoracic aorta is again tortuous.
Calcified granuloma is noted in the right mid lung stable from the
prior exam. Bilateral nipple shadows are seen. No focal infiltrate
or sizable effusion is noted. No bony abnormality is seen.
IMPRESSION: Prior granulomatous disease.  No acute abnormality is seen.

## 2017-04-11 IMAGING — CT CT ABD-PELV W/ CM
2 of 5 series · 16 of 46 positions shown, 18 images · IV contrast (APPLIED)
Comparison: None.

CLINICAL DATA: Acute pancreatitis.  Alcohol abuse.

EXAM:
CT ABDOMEN AND PELVIS WITH CONTRAST
TECHNIQUE: Multidetector CT imaging of the abdomen and pelvis was performed
using the standard protocol following bolus administration of
intravenous contrast.
CONTRAST:  100 cc Omnipaque 300

[Series 3: axial st · axial · 0.69mm/px · z∈[+834,+1224]mm · 13 of 88 slices shown, 15 images]
[im 5/88  soft-tissue]
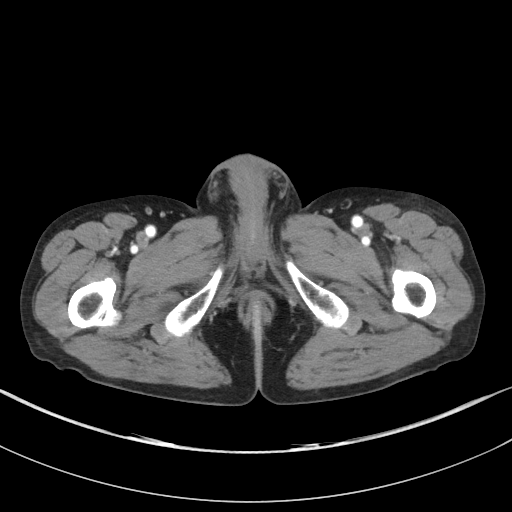
[im 5/88  bone]
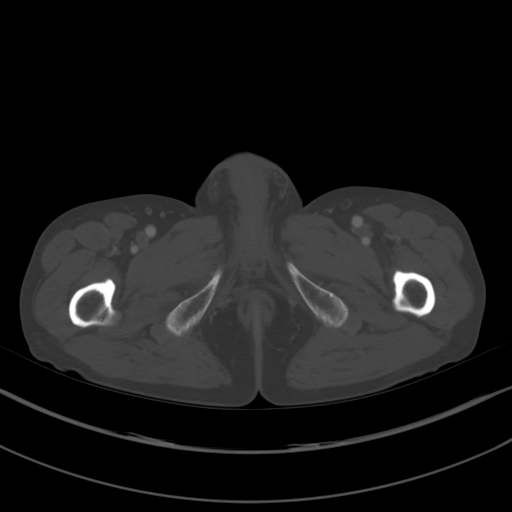
[im 14/88  soft-tissue]
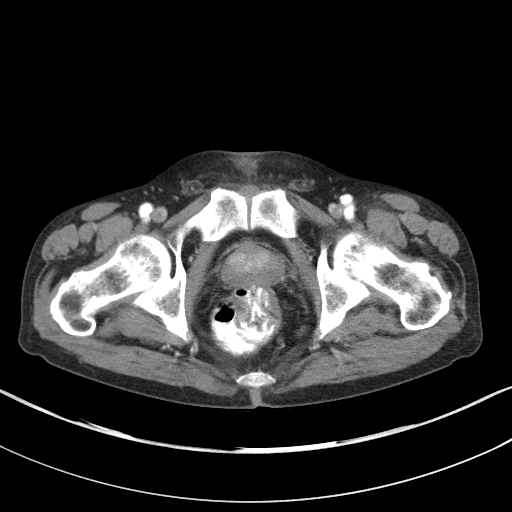
[im 18/88  soft-tissue]
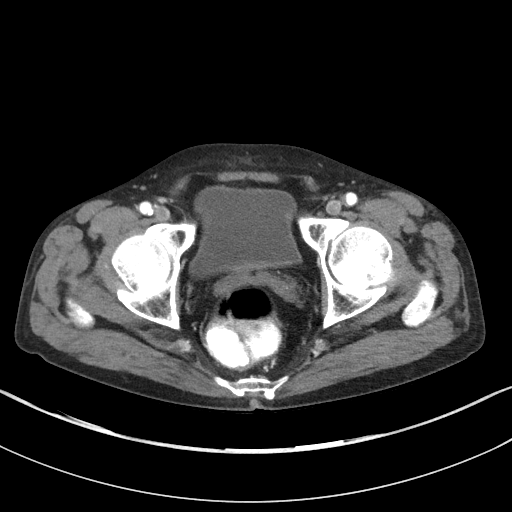
[im 27/88  soft-tissue]
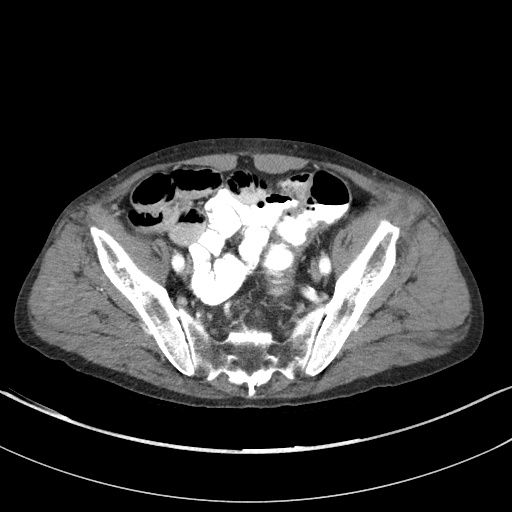
[im 31/88  soft-tissue]
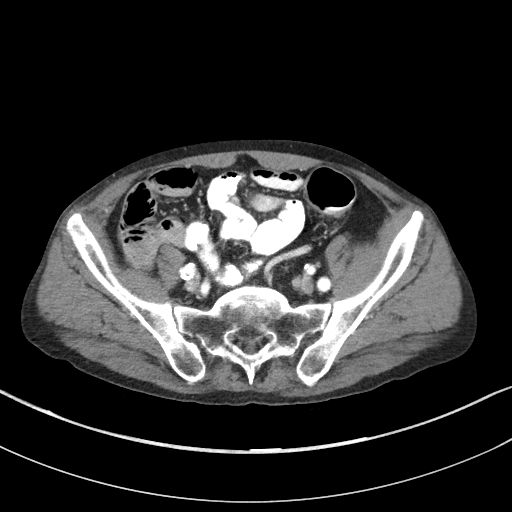
[im 40/88  soft-tissue]
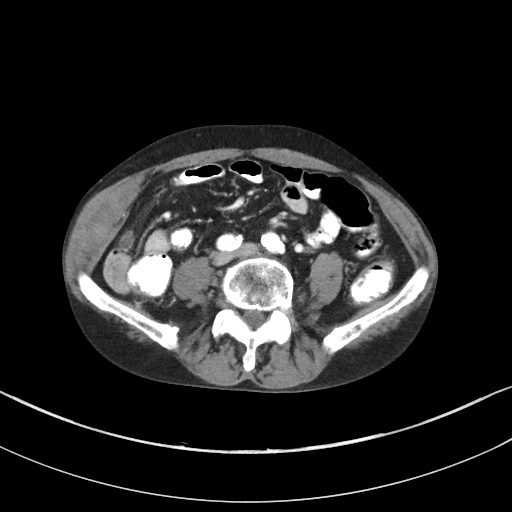
[im 44/88  soft-tissue]
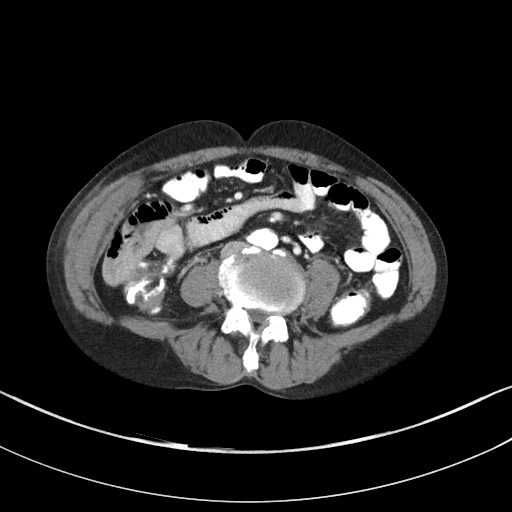
[im 48/88  soft-tissue]
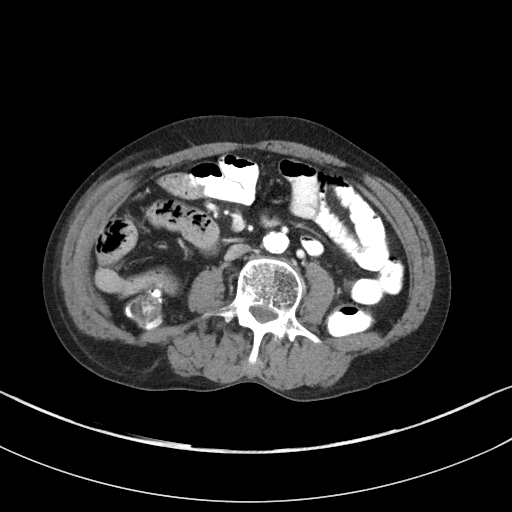
[im 57/88  soft-tissue]
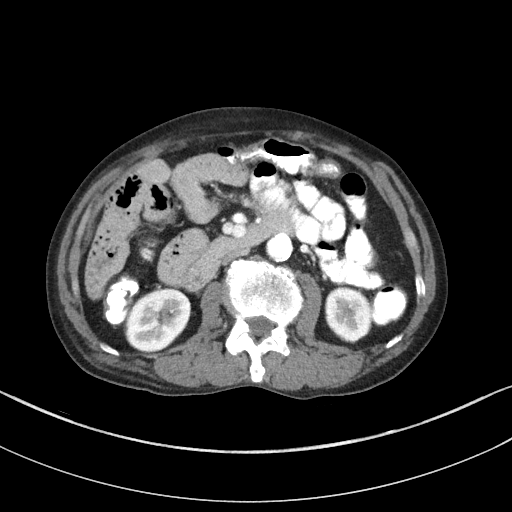
[im 57/88  bone]
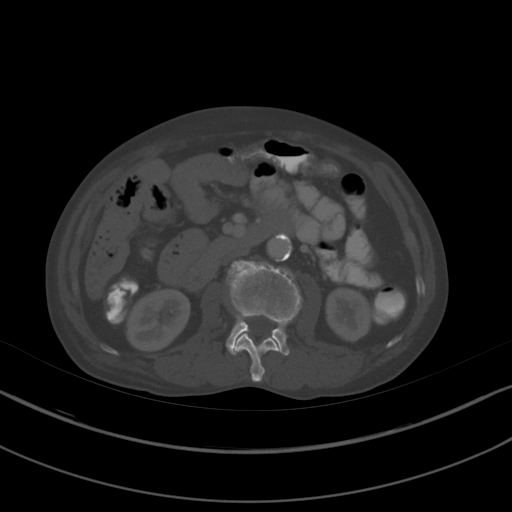
[im 61/88  soft-tissue]
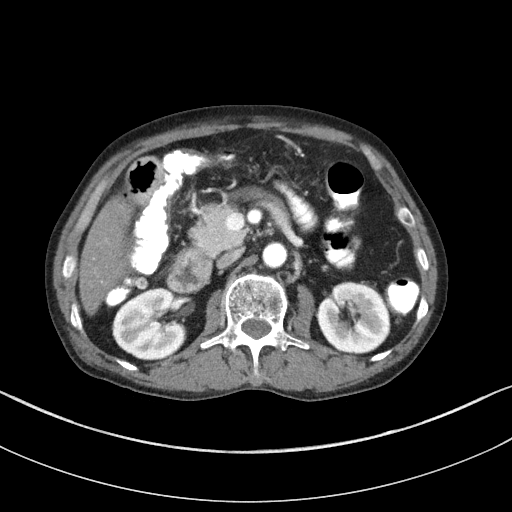
[im 70/88  soft-tissue]
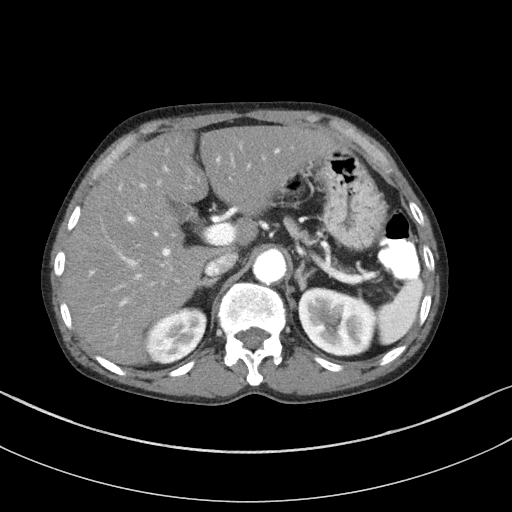
[im 74/88  soft-tissue]
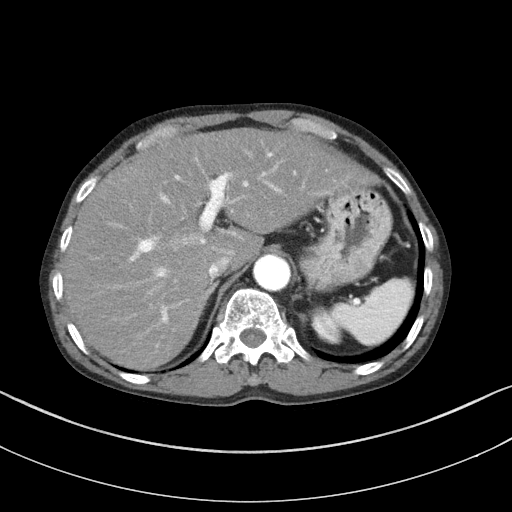
[im 83/88  soft-tissue]
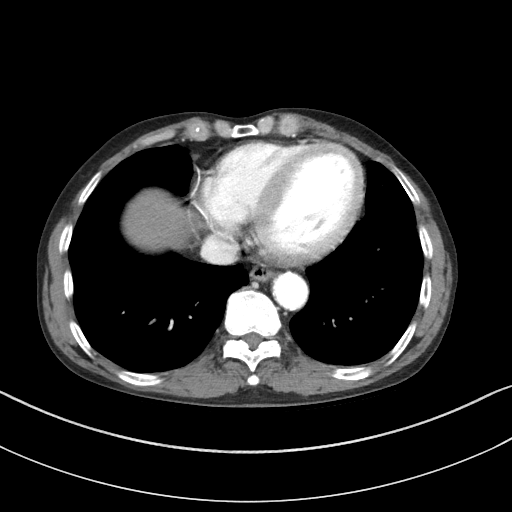

[Series 6: coronal st · coronal · 0.62mm/px · 3 of 71 slices shown]
[im 24/71  soft-tissue]
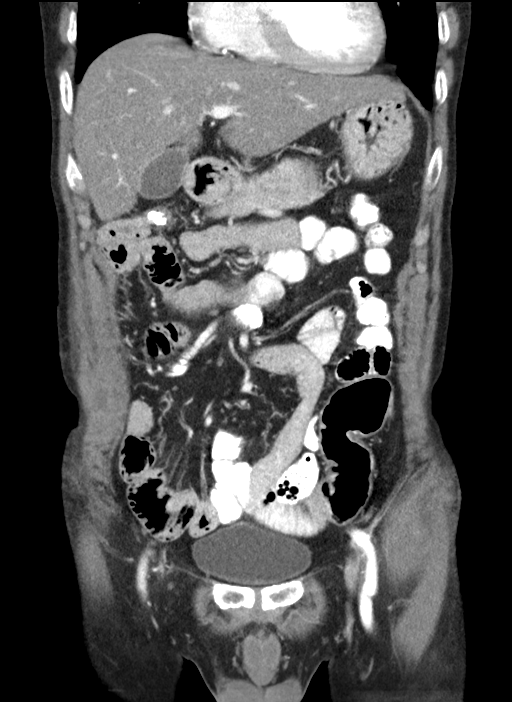
[im 32/71  soft-tissue]
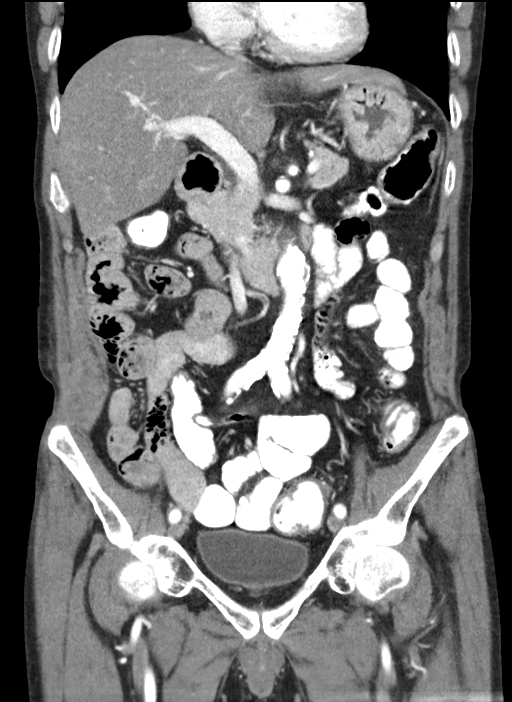
[im 39/71  soft-tissue]
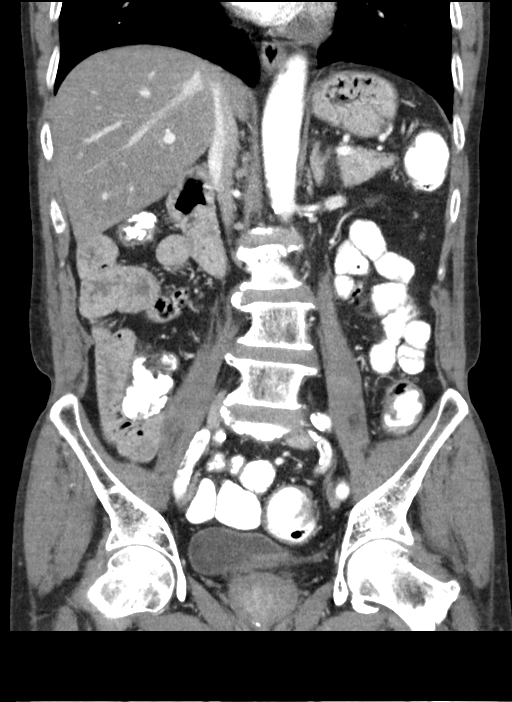

[16 of 46 positions shown; findings below may reference images not displayed]

FINDINGS: Minimal atelectasis

Diffuse hepatic steatosis

Gallbladder, spleen, pancreas, adrenal glands, kidneys are within
normal limits

Diverticulosis throughout the length of the colon. No convincing
wall thickening to suggest acute inflammatory change. Ascending and
proximal transverse colon are decompressed.

Bladder and prostate are within normal limits.

No abnormal adenopathy.  No free-fluid.

Mild levoscoliosis at L3-4.  No vertebral compression.
IMPRESSION: No acute abdominal pathology.

Diffuse hepatic steatosis

Colonic diverticulosis.

## 2017-07-25 ENCOUNTER — Encounter: Payer: Medicare Other | Admitting: Medical

## 2017-08-03 ENCOUNTER — Encounter: Payer: Self-pay | Admitting: Medical

## 2021-01-16 ENCOUNTER — Encounter: Payer: Self-pay | Admitting: Internal Medicine

## 2022-10-01 ENCOUNTER — Encounter: Payer: Self-pay | Admitting: Internal Medicine
